# Patient Record
Sex: Male | Born: 1955
Health system: Southern US, Community
[De-identification: ages and names within clinical notes are randomized; demographics above are authoritative.]

## PROBLEM LIST (undated history)

## (undated) DIAGNOSIS — G8929 Other chronic pain: Secondary | ICD-10-CM

## (undated) DIAGNOSIS — M549 Dorsalgia, unspecified: Secondary | ICD-10-CM

## (undated) DIAGNOSIS — I7121 Aneurysm of the ascending aorta, without rupture: Secondary | ICD-10-CM

## (undated) DIAGNOSIS — E291 Testicular hypofunction: Secondary | ICD-10-CM

## (undated) DIAGNOSIS — E785 Hyperlipidemia, unspecified: Secondary | ICD-10-CM

## (undated) DIAGNOSIS — I712 Thoracic aortic aneurysm, without rupture: Secondary | ICD-10-CM

## (undated) HISTORY — DX: Testicular hypofunction: E29.1

## (undated) HISTORY — DX: Dorsalgia, unspecified: M54.9

## (undated) HISTORY — DX: Other chronic pain: G89.29

## (undated) HISTORY — DX: Aneurysm of the ascending aorta, without rupture: I71.21

## (undated) HISTORY — DX: Thoracic aortic aneurysm, without rupture: I71.2

## (undated) HISTORY — PX: COLONOSCOPY: SHX174

## (undated) HISTORY — DX: Hyperlipidemia, unspecified: E78.5

## (undated) HISTORY — PX: ANTERIOR CRUCIATE LIGAMENT REPAIR: SHX115

---

## 2014-11-07 ENCOUNTER — Telehealth: Payer: Self-pay | Admitting: Cardiology

## 2014-11-07 NOTE — Telephone Encounter (Signed)
Received records from [facility] for appointment on 12/19/14 with Dr Percival Spanish.  Records given to [facility] (medical records) for Dr Hochrein's schedule on 12/19/14.  lp

## 2014-12-19 ENCOUNTER — Ambulatory Visit (INDEPENDENT_AMBULATORY_CARE_PROVIDER_SITE_OTHER): Payer: BLUE CROSS/BLUE SHIELD | Admitting: Cardiology

## 2014-12-19 ENCOUNTER — Encounter: Payer: Self-pay | Admitting: Cardiology

## 2014-12-19 ENCOUNTER — Telehealth: Payer: Self-pay | Admitting: Cardiology

## 2014-12-19 VITALS — BP 128/80 | HR 62 | Ht 69.0 in | Wt 183.0 lb

## 2014-12-19 DIAGNOSIS — I712 Thoracic aortic aneurysm, without rupture, unspecified: Secondary | ICD-10-CM

## 2014-12-19 NOTE — Progress Notes (Signed)
Cardiology Office Note   Date:  12/20/2014   ID:  Gary Moody, DOB 1955-10-12, MRN [id number]  PCP:  Velna Hatchet, MD  Cardiologist:   Minus Breeding, MD   Chief Complaint  Patient presents with  . Thoracic Aortic Aneurysm      History of Present Illness: Gary Moody is a 59 y.o. male who presents for a sending aortic aneurysm. He moved from [place] with this was followed for years. I don't have any of these records. Apparently there was no other significant heart disease. He's had some palpitations over the years but this has been controlled to a large degree with diet and caffeine restriction. He otherwise has done well. He was told not to do endurance athletics but he still does walking and other activities without limitations. The patient denies any new symptoms such as chest discomfort, neck or arm discomfort. There has been no new shortness of breath, PND or orthopnea. There has been no presyncope or syncope.  Past Medical History  Diagnosis Date  . Hypogonadism in male   . Chronic back pain   . Thoracic ascending aortic aneurysm     Past Surgical History  Procedure Laterality Date  . Anterior cruciate ligament repair       Current Outpatient Prescriptions  Medication Sig Dispense Refill  . atorvastatin (LIPITOR) 20 MG tablet Take 20 mg by mouth daily.    Marland Kitchen atorvastatin (LIPITOR) 40 MG tablet Take 40 mg by mouth daily.    Marland Kitchen losartan (COZAAR) 50 MG tablet Take 50 mg by mouth daily.    . metoprolol succinate (TOPROL-XL) 25 MG 24 hr tablet Take 25 mg by mouth daily.    Marland Kitchen testosterone (ANDROGEL) 50 MG/5GM (1%) GEL Place 5 g onto the skin daily.     No current facility-administered medications for this visit.    Allergies:   Avelox and Sulfa antibiotics    Social History:  The patient  reports that he has never smoked. He does not have any smokeless tobacco history on file.   Family History:  The patient's family history includes CAD in his father;  Diabetes in his mother; Syncope episode in his son and another family member.    ROS:  Please see the history of present illness.   Otherwise, review of systems are positive for none.   All other systems are reviewed and negative.    PHYSICAL EXAM: VS:  BP 128/80 mmHg  Pulse 62  Ht 5\' 9"  (1.753 m)  Wt 183 lb (83.008 kg)  BMI 27.01 kg/m2 , BMI Body mass index is 27.01 kg/(m^2). GENERAL:  Well appearing HEENT:  Pupils equal round and reactive, fundi not visualized, oral mucosa unremarkable NECK:  No jugular venous distention, waveform within normal limits, carotid upstroke brisk and symmetric, no bruits, no thyromegaly LYMPHATICS:  No cervical, inguinal adenopathy LUNGS:  Clear to auscultation bilaterally BACK:  No CVA tenderness CHEST:  Unremarkable HEART:  PMI not displaced or sustained,S1 and S2 within normal limits, no S3, no S4, no clicks, no rubs, no murmurs ABD:  Flat, positive bowel sounds normal in frequency in pitch, no bruits, no rebound, no guarding, no midline pulsatile mass, no hepatomegaly, no splenomegaly EXT:  2 plus pulses throughout, no edema, no cyanosis no clubbing SKIN:  No rashes no nodules NEURO:  Cranial nerves II through XII grossly intact, motor grossly intact throughout PSYCH:  Cognitively intact, oriented to person place and time    EKG:  EKG is ordered  today. The ekg ordered today demonstrates sinus bradycardia, rate 46, axis within normal limits, intervals within normal limits, no acute ST-T wave changes.   Wt Readings from Last 3 Encounters:  12/19/14 183 lb (83.008 kg)      Other studies Reviewed: Additional studies/ records that were reviewed today include: Outside office records. Review of the above records demonstrates:  Please see elsewhere in the note.     ASSESSMENT AND PLAN:  ASCENDING ANEURYSM:   I would like to have the outside records be ordering an MRI.   further evaluation and management will be based on a review these outside  records and then a follow-up study. He can continue the meds as listed however.   DYSLIPIDEMIA:  This is to be followed by    Current medicines are reviewed at length with the patient today.  The patient does not have concerns regarding medicines.  The following changes have been made:  no change  Labs/ tests ordered today include:   No orders of the defined types were placed in this encounter.     Disposition:   FU with me in one year.    Signed, Minus Breeding, MD  12/20/2014 12:55 PM    [facility]

## 2014-12-19 NOTE — Patient Instructions (Signed)
Your physician recommends that you schedule a follow-up appointment in: as needed with Dr. Hochrein  

## 2014-12-20 ENCOUNTER — Encounter: Payer: Self-pay | Admitting: Cardiology

## 2014-12-20 ENCOUNTER — Other Ambulatory Visit: Payer: Self-pay | Admitting: *Deleted

## 2014-12-20 DIAGNOSIS — I712 Thoracic aortic aneurysm, without rupture, unspecified: Secondary | ICD-10-CM | POA: Insufficient documentation

## 2014-12-20 NOTE — Telephone Encounter (Signed)
Allergies added

## 2014-12-20 NOTE — Telephone Encounter (Signed)
Close encounter 

## 2014-12-24 ENCOUNTER — Other Ambulatory Visit: Payer: Self-pay | Admitting: Cardiology

## 2014-12-24 MED ORDER — LOSARTAN POTASSIUM 50 MG PO TABS: 50.0000 mg | ORAL_TABLET | Freq: Every day | ORAL | Status: DC

## 2014-12-24 MED ORDER — ATORVASTATIN CALCIUM 20 MG PO TABS
20.0000 mg | ORAL_TABLET | Freq: Every day | ORAL | Status: DC
Start: 2014-12-24 — End: 2015-12-21

## 2014-12-24 MED ORDER — ATORVASTATIN CALCIUM 40 MG PO TABS
40.0000 mg | ORAL_TABLET | Freq: Every day | ORAL | Status: DC
Start: 2014-12-24 — End: 2015-12-21

## 2014-12-24 MED ORDER — METOPROLOL SUCCINATE ER 25 MG PO TB24
25.0000 mg | ORAL_TABLET | Freq: Every day | ORAL | Status: DC
Start: 2014-12-24 — End: 2015-12-25

## 2014-12-24 NOTE — Addendum Note (Signed)
Addended by: Diana Eves on: 12/24/2014 11:11 AM   Modules accepted: Orders

## 2014-12-24 NOTE — Telephone Encounter (Signed)
°  1. Which medications need to be refilled? Losartan,Atorvastatin 40 mg and 20 mg and Metoprolol 2. Which pharmacy is medication to be sent to?[facility]-Please do this asap please.  3. Do they need a 30 day or 90 day supply?#280 for Losartan and #90 fir the rest of them 4. Would they like a call back once the medication has been sent to the pharmacy? yes

## 2014-12-24 NOTE — Telephone Encounter (Signed)
Rx(s) sent to pharmacy electronically.  

## 2015-01-28 ENCOUNTER — Encounter: Payer: Self-pay | Admitting: Cardiology

## 2015-01-29 ENCOUNTER — Other Ambulatory Visit: Payer: Self-pay | Admitting: *Deleted

## 2015-01-29 ENCOUNTER — Telehealth: Payer: Self-pay | Admitting: Cardiology

## 2015-01-29 DIAGNOSIS — I712 Thoracic aortic aneurysm, without rupture, unspecified: Secondary | ICD-10-CM

## 2015-01-29 NOTE — Telephone Encounter (Signed)
Pt's wife called in stating that when the pt came in to see Dr. Percival Spanish in May she says that his prescription for Losartan was decreased to once a day. She says originally it was written for BID. She just wanted to confirm that the dosage is correct. Please call  Thanks

## 2015-01-31 ENCOUNTER — Other Ambulatory Visit: Payer: BLUE CROSS/BLUE SHIELD

## 2015-01-31 ENCOUNTER — Other Ambulatory Visit: Payer: Self-pay | Admitting: Cardiology

## 2015-01-31 MED ORDER — LOSARTAN POTASSIUM 50 MG PO TABS: 50.0000 mg | ORAL_TABLET | Freq: Two times a day (BID) | ORAL | Status: DC

## 2015-01-31 NOTE — Telephone Encounter (Signed)
If he is taking it twice daily he should continue with this.

## 2015-01-31 NOTE — Telephone Encounter (Signed)
Spoke with pt wife, told her that her Husband need to continue take Losartan 50 mg 2 tablets daily.  [facility]ills

## 2015-02-07 ENCOUNTER — Ambulatory Visit
Admission: RE | Admit: 2015-02-07 | Discharge: 2015-02-07 | Disposition: A | Discharge status: Home / Self Care | Payer: BLUE CROSS/BLUE SHIELD | Source: Ambulatory Visit | Attending: Cardiology | Admitting: Cardiology

## 2015-02-07 DIAGNOSIS — I712 Thoracic aortic aneurysm, without rupture, unspecified: Secondary | ICD-10-CM

## 2015-02-07 MED ORDER — GADOBENATE DIMEGLUMINE 529 MG/ML IV SOLN
17.0000 mL | Freq: Once | INTRAVENOUS | Status: AC | PRN
  Administered 2015-02-07: 17 mL via INTRAVENOUS

## 2015-02-11 ENCOUNTER — Telehealth: Payer: Self-pay | Admitting: Cardiology

## 2015-02-11 NOTE — Telephone Encounter (Signed)
Faxed signed release to Dr Etta Grandchild - [facility] for records requested by Dr Percival Spanish.  Faxed on 02/11/15. lp

## 2015-02-12 ENCOUNTER — Telehealth: Payer: Self-pay | Admitting: Cardiology

## 2015-02-12 NOTE — Telephone Encounter (Signed)
I have received multiple notes from the patient's previous physician in [place]. The patient has had a stable thoracic aortic dilatation for many years dating back at least 2008. It has been 44-45 mm. The MRI that we did recently was 22. This is essentially unchanged. As he is new DOS I will repeat this MRI in 6 months. This will need to be scheduled. Of note the patient did have some coronary calcification previously but no suggestion of obstructive coronary disease though I don't see a CT angiogram that would clearly define this. There was a vague right lung lesion. I don't see a follow-up of this since 2015.  However there were no gross abnormalities on the lungs on the recent MRI.

## 2015-02-21 ENCOUNTER — Other Ambulatory Visit: Payer: Self-pay | Admitting: *Deleted

## 2015-02-21 DIAGNOSIS — I712 Thoracic aortic aneurysm, without rupture, unspecified: Secondary | ICD-10-CM

## 2015-02-26 ENCOUNTER — Other Ambulatory Visit: Payer: Self-pay | Admitting: Internal Medicine

## 2015-02-26 DIAGNOSIS — R911 Solitary pulmonary nodule: Secondary | ICD-10-CM

## 2015-03-01 ENCOUNTER — Ambulatory Visit
Admission: RE | Admit: 2015-03-01 | Discharge: 2015-03-01 | Disposition: A | Discharge status: Home / Self Care | Payer: BLUE CROSS/BLUE SHIELD | Source: Ambulatory Visit | Attending: Internal Medicine | Admitting: Internal Medicine

## 2015-03-01 DIAGNOSIS — R911 Solitary pulmonary nodule: Secondary | ICD-10-CM

## 2015-03-08 ENCOUNTER — Telehealth: Payer: Self-pay | Admitting: Cardiology

## 2015-03-08 NOTE — Telephone Encounter (Signed)
Called patient and let him know that Dr. Percival Spanish is out of town on vacation and would not be able to get back to him until at least next week  I told him that I would forward his message to Dr. Percival Spanish and his nurse Scharlene Corn CMA

## 2015-03-08 NOTE — Telephone Encounter (Signed)
Pt is calling in to see what Dr. Rosezella [place] thoughts were on the records that he received from his last doctor. Please call and f/u with the pt  Thanks

## 2015-03-25 NOTE — Telephone Encounter (Signed)
Has this been handled?

## 2015-03-26 NOTE — Telephone Encounter (Signed)
Can this be closed ?

## 2015-03-28 NOTE — Telephone Encounter (Signed)
Spoke to dr Percival Spanish about pt, stated he will personal call pt with MRI result and comparison

## 2015-03-29 ENCOUNTER — Encounter: Payer: Self-pay | Admitting: *Deleted

## 2015-12-21 ENCOUNTER — Other Ambulatory Visit: Payer: Self-pay | Admitting: Cardiology

## 2015-12-23 NOTE — Telephone Encounter (Signed)
Rx(s) sent to pharmacy electronically.  

## 2015-12-24 ENCOUNTER — Ambulatory Visit (INDEPENDENT_AMBULATORY_CARE_PROVIDER_SITE_OTHER): Payer: BLUE CROSS/BLUE SHIELD | Admitting: Cardiology

## 2015-12-24 ENCOUNTER — Encounter: Payer: Self-pay | Admitting: Cardiology

## 2015-12-24 VITALS — BP 138/82 | HR 61 | Ht 69.0 in | Wt 181.0 lb

## 2015-12-24 DIAGNOSIS — I2510 Atherosclerotic heart disease of native coronary artery without angina pectoris: Secondary | ICD-10-CM | POA: Diagnosis not present

## 2015-12-24 DIAGNOSIS — R931 Abnormal findings on diagnostic imaging of heart and coronary circulation: Secondary | ICD-10-CM

## 2015-12-24 DIAGNOSIS — I712 Thoracic aortic aneurysm, without rupture, unspecified: Secondary | ICD-10-CM

## 2015-12-24 NOTE — Progress Notes (Signed)
Cardiology Office Note   Date:  12/24/2015   ID:  Gary Moody, DOB 1955/09/11, MRN [id number]  PCP:  Velna Hatchet, MD  Cardiologist:   Minus Breeding, MD   No chief complaint on file.     History of Present Illness: Gary Moody is a 60 y.o. male who presents for a sending aortic aneurysm. He moved from [place] last year.  I was able to review these records.  I followed up last year with an MRI and he had a 4.7 x 4.7 ascending aneurysm stable from previous. His primary care provider also order a chest CT for other reasons. There was some coronary calcium noted.  He does very well. He walks a lot at work. He's had some palpitations over the years but this is stable.  The patient denies any new symptoms such as chest discomfort, neck or arm discomfort. There has been no new shortness of breath, PND or orthopnea. There has been no presyncope or syncope.   Past Medical History  Diagnosis Date  . Hypogonadism in male   . Chronic back pain   . Thoracic ascending aortic aneurysm [facility])     Past Surgical History  Procedure Laterality Date  . Anterior cruciate ligament repair       Current Outpatient Prescriptions  Medication Sig Dispense Refill  . atorvastatin (LIPITOR) 20 MG tablet TAKE 1 TABLET DAILY 90 tablet 2  . atorvastatin (LIPITOR) 40 MG tablet TAKE 1 TABLET DAILY 90 tablet 2  . losartan (COZAAR) 50 MG tablet Take 1 tablet (50 mg total) by mouth 2 (two) times daily. 180 tablet 3  . metoprolol succinate (TOPROL-XL) 25 MG 24 hr tablet Take 1 tablet (25 mg total) by mouth daily. 90 tablet 3  . testosterone (ANDROGEL) 50 MG/5GM (1%) GEL Place 5 g onto the skin daily.     No current facility-administered medications for this visit.    Allergies:   Avelox and Sulfa antibiotics    ROS:  Please see the history of present illness.   Otherwise, review of systems are positive for none.   All other systems are reviewed and negative.    PHYSICAL EXAM: VS:  BP 138/82  mmHg  Pulse 61  Ht 5\' 9"  (1.753 m)  Wt 181 lb (82.101 kg)  BMI 26.72 kg/m2  SpO2 96% , BMI Body mass index is 26.72 kg/(m^2). GENERAL:  Well appearing NECK:  No jugular venous distention, waveform within normal limits, carotid upstroke brisk and symmetric, no bruits, no thyromegaly LUNGS:  Clear to auscultation bilaterally BACK:  No CVA tenderness CHEST:  Unremarkable HEART:  PMI not displaced or sustained,S1 and S2 within normal limits, no S3, no S4, no clicks, no rubs, no murmurs ABD:  Flat, positive bowel sounds normal in frequency in pitch, no bruits, no rebound, no guarding, no midline pulsatile mass, no hepatomegaly, no splenomegaly EXT:  2 plus pulses throughout, no edema, no cyanosis no clubbing   EKG:  EKG is ordered today. The ekg ordered today demonstrates sinus bradycardia, rate 61, axis within normal limits, intervals within normal limits, no acute ST-T wave changes.   Wt Readings from Last 3 Encounters:  12/24/15 181 lb (82.101 kg)  12/19/14 183 lb (83.008 kg)      Other studies Reviewed: Additional studies/ records that were reviewed today include: MRI, CT Review of the above records demonstrates:  Please see elsewhere in the note.     ASSESSMENT AND PLAN:  ASCENDING ANEURYSM:   I  will follow up with a repeat MRI in Dec.  DYSLIPIDEMIA:  This is to be followed by Velna Hatchet, MD   CORONARY CALCIUM:   I will bring the patient back for a POET (Plain Old Exercise Test). This will allow me to screen for obstructive coronary disease, risk stratify and very importantly provide a prescription for exercise.   Current medicines are reviewed at length with the patient today.  The patient does not have concerns regarding medicines.  The following changes have been made:  no change  Labs/ tests ordered today include:      Orders Placed This Encounter  Procedures  . MR MRA CHEST W WO CONTRAST  . Exercise Tolerance Test  . EKG 12-Lead     Disposition:   FU  with me in one year.    Signed, Minus Breeding, MD  12/24/2015 5:37 PM    [place]

## 2015-12-24 NOTE — Patient Instructions (Addendum)
Medication Instructions:  Continue current medications  Labwork: NONE  Testing/Procedures: Your physician has requested that you have an exercise tolerance test. For further information please visit [email or web address]. Please also follow instruction sheet, as given.  Your physician has requested that you have an MRI in December 2017   Follow-Up: Your physician wants you to follow-up in: 1 Year. You will receive a reminder letter in the mail two months in advance. If you don't receive a letter, please call our office to schedule the follow-up appointment.  Any Other Special Instructions Will Be Listed Below (If Applicable).  If you need a refill on your cardiac medications before your next appointment, please call your pharmacy.

## 2015-12-25 ENCOUNTER — Encounter: Payer: Self-pay | Admitting: Cardiology

## 2015-12-25 ENCOUNTER — Telehealth: Payer: Self-pay | Admitting: Cardiology

## 2015-12-25 MED ORDER — METOPROLOL SUCCINATE ER 25 MG PO TB24: 25.0000 mg | ORAL_TABLET | Freq: Every day | ORAL | Status: DC

## 2015-12-25 MED ORDER — LOSARTAN POTASSIUM 50 MG PO TABS: 50.0000 mg | ORAL_TABLET | Freq: Two times a day (BID) | ORAL | Status: DC

## 2015-12-25 NOTE — Telephone Encounter (Signed)
Patient was seen yesterday by Dr. Percival Spanish and would like [place] to manage all of his medications.  He needs refills for his Losartan 50 mg # 90 with 3 refills and the metoprolol succinate 25 mg # 90 with 3 refills.  He would like these sent to [facility].

## 2015-12-25 NOTE — Telephone Encounter (Signed)
Rx(s) sent to pharmacy electronically.  

## 2015-12-25 NOTE — Telephone Encounter (Signed)
Left message for patient to call back to schedule exercise tolerance test per Dr. Percival Spanish.

## 2016-01-02 ENCOUNTER — Encounter: Payer: Self-pay | Admitting: Cardiology

## 2016-01-13 ENCOUNTER — Telehealth: Payer: Self-pay | Admitting: Cardiology

## 2016-01-13 NOTE — Telephone Encounter (Signed)
Spoke to patient Information explained to patient - exercise tolerance test Reviewed last office note. Patient verbalized understanding.

## 2016-01-13 NOTE — Telephone Encounter (Signed)
New message      The pt was wanting to speak with the nurse about the reason for the "Stress Test", cause the pt states he is not having any symptoms and would like some insight why he might need a stress test.

## 2016-01-16 ENCOUNTER — Inpatient Hospital Stay (HOSPITAL_COMMUNITY): Admission: RE | Admit: 2016-01-16 | Payer: BLUE CROSS/BLUE SHIELD | Source: Ambulatory Visit

## 2016-04-30 ENCOUNTER — Telehealth (HOSPITAL_COMMUNITY): Payer: Self-pay | Admitting: Cardiology

## 2016-04-30 NOTE — Telephone Encounter (Signed)
Pt has been contacted 4 times to get his ETT scheduled(6/14,7/6,9/12,9/27) and have yet to receive a response. I will be removing him from the workqueue.

## 2016-04-30 NOTE — Telephone Encounter (Signed)
Pt ended up calling back and and he was scheduled for 11/8.

## 2016-04-30 NOTE — Telephone Encounter (Signed)
ok 

## 2016-05-04 NOTE — Telephone Encounter (Signed)
Great - thanks

## 2016-06-05 ENCOUNTER — Telehealth (HOSPITAL_COMMUNITY): Payer: Self-pay

## 2016-06-05 NOTE — Telephone Encounter (Signed)
Encounter complete. 

## 2016-06-10 ENCOUNTER — Inpatient Hospital Stay (HOSPITAL_COMMUNITY): Admission: RE | Admit: 2016-06-10 | Payer: BLUE CROSS/BLUE SHIELD | Source: Ambulatory Visit

## 2016-06-19 ENCOUNTER — Telehealth (HOSPITAL_COMMUNITY): Payer: Self-pay

## 2016-06-19 NOTE — Telephone Encounter (Signed)
Encounter complete. 

## 2016-06-23 ENCOUNTER — Ambulatory Visit (HOSPITAL_COMMUNITY)
Admission: RE | Admit: 2016-06-23 | Discharge: 2016-06-23 | Disposition: A | Discharge status: Home / Self Care | Payer: BLUE CROSS/BLUE SHIELD | Source: Ambulatory Visit | Attending: Cardiovascular Disease | Admitting: Cardiovascular Disease

## 2016-06-23 DIAGNOSIS — R931 Abnormal findings on diagnostic imaging of heart and coronary circulation: Secondary | ICD-10-CM | POA: Insufficient documentation

## 2016-06-23 LAB — EXERCISE TOLERANCE TEST
CHL CUP RESTING HR STRESS: 70 {beats}/min
CSEPEW: 13.4 METS
CSEPPHR: 160 {beats}/min
Exercise duration (min): 11 min
Exercise duration (sec): 23 s
MPHR: 161 {beats}/min
Percent HR: 99 %
RPE: 17

## 2016-07-07 ENCOUNTER — Ambulatory Visit (HOSPITAL_COMMUNITY): Admission: RE | Admit: 2016-07-07 | Payer: BLUE CROSS/BLUE SHIELD | Source: Ambulatory Visit

## 2016-07-08 ENCOUNTER — Ambulatory Visit (HOSPITAL_COMMUNITY)
Admission: RE | Admit: 2016-07-08 | Discharge: 2016-07-08 | Disposition: A | Discharge status: Home / Self Care | Payer: BLUE CROSS/BLUE SHIELD | Source: Ambulatory Visit | Attending: Cardiology | Admitting: Cardiology

## 2016-07-08 DIAGNOSIS — I712 Thoracic aortic aneurysm, without rupture, unspecified: Secondary | ICD-10-CM

## 2016-07-08 MED ORDER — GADOBENATE DIMEGLUMINE 529 MG/ML IV SOLN
20.0000 mL | Freq: Once | INTRAVENOUS | Status: AC | PRN
  Administered 2016-07-08: 20 mL via INTRAVENOUS

## 2016-09-09 ENCOUNTER — Encounter: Payer: Self-pay | Admitting: Internal Medicine

## 2016-09-18 ENCOUNTER — Other Ambulatory Visit: Payer: Self-pay | Admitting: Cardiology

## 2016-09-18 NOTE — Telephone Encounter (Signed)
REFILL 

## 2017-01-11 NOTE — Progress Notes (Signed)
Cardiology Office Note   Date:  01/14/2017   ID:  Gary Moody, DOB 1955/11/12, MRN [id number]  PCP:  Velna Hatchet, MD  Cardiologist:   Minus Breeding, MD   Chief Complaint  Patient presents with  . Ascending Aortic Aneursym      History of Present Illness: Gary Moody is a 61 y.o. male who presents for a sending aortic aneurysm. He moved from [place] .  I followed up last Dec with an MRI that was 47 mm and unchanged from previous.  He has had coronary calcium and had a negative POET (Plain Old Exercise Treadmill) last year.  Since then he has done well.  The patient denies any new symptoms such as chest discomfort, neck or arm discomfort. There has been no new shortness of breath, PND or orthopnea. There have been no reported palpitations, presyncope or syncope.    Past Medical History:  Diagnosis Date  . Chronic back pain   . Hypogonadism in male   . Thoracic ascending aortic aneurysm [facility])     Past Surgical History:  Procedure Laterality Date  . ANTERIOR CRUCIATE LIGAMENT REPAIR       Current Outpatient Prescriptions  Medication Sig Dispense Refill  . atorvastatin (LIPITOR) 20 MG tablet TAKE 1 TABLET DAILY 90 tablet 1  . atorvastatin (LIPITOR) 40 MG tablet TAKE 1 TABLET DAILY 90 tablet 1  . losartan (COZAAR) 50 MG tablet Take 1 tablet (50 mg total) by mouth 2 (two) times daily. 180 tablet 3  . metoprolol succinate (TOPROL-XL) 25 MG 24 hr tablet Take 1 tablet (25 mg total) by mouth daily. 90 tablet 3  . testosterone (ANDROGEL) 50 MG/5GM (1%) GEL Place 5 g onto the skin daily.     No current facility-administered medications for this visit.     Allergies:   Avelox [moxifloxacin hcl in nacl] and Sulfa antibiotics    ROS:  Please see the history of present illness.   Otherwise, review of systems are positive for varicose veins.   All other systems are reviewed and negative.    PHYSICAL EXAM: VS:  BP 114/70   Pulse (!) 58   Ht 5\' 9"  (1.753 m)   Wt  176 lb (79.8 kg)   BMI 25.99 kg/m  , BMI Body mass index is 25.99 kg/m.  GENERAL:  Well appearing NECK:  No jugular venous distention, waveform within normal limits, carotid upstroke brisk and symmetric, no bruits, no thyromegaly LUNGS:  Clear to auscultation bilaterally BACK:  No CVA tenderness CHEST:  Unremarkable HEART:  PMI not displaced or sustained,S1 and S2 within normal limits, no S3, no S4, no clicks, no rubs, no murmurs ABD:  Flat, positive bowel sounds normal in frequency in pitch, no bruits, no rebound, no guarding, no midline pulsatile mass, no hepatomegaly, no splenomegaly EXT:  2 plus pulses throughout, no edema, no cyanosis no clubbing, mild varicose veins   EKG:  EKG is  ordered today. The ekg ordered today demonstrates sinus bradycardia, rate 58, axis within normal limits, intervals within normal limits, no acute ST-T wave changes.   Wt Readings from Last 3 Encounters:  01/14/17 176 lb (79.8 kg)  12/24/15 181 lb (82.1 kg)  12/19/14 183 lb (83 kg)      Other studies Reviewed: Additional studies/ records that were reviewed today include: MRI results, I personally reviewed CT films from last year. Review of the above records demonstrates:  Please see elsewhere in the note.     ASSESSMENT  AND PLAN:  ASCENDING ANEURYSM:   This was 47 mm on MRI in Dec of last year and this was stable.  I will follow up with a repeat MRI in Dec of this year.  DYSLIPIDEMIA:  This is to be followed by Velna Hatchet, MD   LDL last year was 12 with HDL of 43.  This will be repeated this month.   CORONARY CALCIUM:   POET (Plain Old Exercise Treadmill) was negative last November.  I looked at the CT and he had mild calcium and he was able to exercise for almost 12 minutes last year on the treadmill.  He is very active and has no symptoms.  I don't think that any further work up is indicated.  He will continue with risk reduction.    Current medicines are reviewed at length with the  patient today.  The patient does not have concerns regarding medicines.  The following changes have been made:  None  Labs/ tests ordered today include:      Orders Placed This Encounter  Procedures  . MR MRA CHEST W WO CONTRAST  . EKG 12-Lead     Disposition:   FU with me in 12 months.    Signed, Minus Breeding, MD  01/14/2017 10:16 AM    [place]

## 2017-01-14 ENCOUNTER — Encounter: Payer: Self-pay | Admitting: Cardiology

## 2017-01-14 ENCOUNTER — Ambulatory Visit (INDEPENDENT_AMBULATORY_CARE_PROVIDER_SITE_OTHER): Payer: BLUE CROSS/BLUE SHIELD | Admitting: Cardiology

## 2017-01-14 VITALS — BP 114/70 | HR 58 | Ht 69.0 in | Wt 176.0 lb

## 2017-01-14 DIAGNOSIS — R931 Abnormal findings on diagnostic imaging of heart and coronary circulation: Secondary | ICD-10-CM | POA: Diagnosis not present

## 2017-01-14 DIAGNOSIS — E785 Hyperlipidemia, unspecified: Secondary | ICD-10-CM | POA: Diagnosis not present

## 2017-01-14 DIAGNOSIS — I712 Thoracic aortic aneurysm, without rupture, unspecified: Secondary | ICD-10-CM

## 2017-01-14 DIAGNOSIS — I10 Essential (primary) hypertension: Secondary | ICD-10-CM | POA: Diagnosis not present

## 2017-01-14 NOTE — Patient Instructions (Signed)
Medication Instructions:  Continue current medications  Labwork: None Ordered  Testing/Procedures: Your physician has requested that you have a MRI/MRA in December  Follow-Up: Your physician wants you to follow-up in: 1 Year. You will receive a reminder letter in the mail two months in advance. If you don't receive a letter, please call our office to schedule the follow-up appointment.   Any Other Special Instructions Will Be Listed Below (If Applicable).   If you need a refill on your cardiac medications before your next appointment, please call your pharmacy.

## 2017-01-20 ENCOUNTER — Telehealth: Payer: Self-pay | Admitting: Cardiology

## 2017-01-20 NOTE — Telephone Encounter (Signed)
Called and LVM for the patient to call me back regarding time of day he would like his MRA.

## 2017-01-22 ENCOUNTER — Other Ambulatory Visit: Payer: Self-pay | Admitting: Cardiology

## 2017-02-01 ENCOUNTER — Encounter: Payer: Self-pay | Admitting: Cardiology

## 2017-02-10 ENCOUNTER — Encounter: Payer: Self-pay | Admitting: Cardiology

## 2017-02-12 ENCOUNTER — Ambulatory Visit (HOSPITAL_COMMUNITY): Payer: BLUE CROSS/BLUE SHIELD

## 2017-02-27 ENCOUNTER — Other Ambulatory Visit: Payer: Self-pay | Admitting: Cardiology

## 2017-03-10 ENCOUNTER — Encounter: Payer: Self-pay | Admitting: Gastroenterology

## 2017-03-17 ENCOUNTER — Other Ambulatory Visit: Payer: Self-pay | Admitting: Cardiology

## 2017-04-28 ENCOUNTER — Ambulatory Visit (AMBULATORY_SURGERY_CENTER): Payer: Self-pay | Admitting: *Deleted

## 2017-04-28 VITALS — Ht 69.0 in | Wt 177.0 lb

## 2017-04-28 DIAGNOSIS — Z1211 Encounter for screening for malignant neoplasm of colon: Secondary | ICD-10-CM

## 2017-04-28 MED ORDER — NA SULFATE-K SULFATE-MG SULF 17.5-3.13-1.6 GM/177ML PO SOLN: 1.0000 | Freq: Once | ORAL | 0 refills | Status: AC

## 2017-04-28 NOTE — Progress Notes (Signed)
No egg or soy allergy known to patient  No issues with past sedation with any surgeries  or procedures, no intubation problems  No diet pills per patient No home 02 use per patient  No blood thinners per patient  Pt denies issues with constipation  No A fib or A flutter  EMMI video sent to pt's e mail -- pt declined $15 dollar coupon for suprep given to pt today  In PV  Pt states his dad may have had colon cancer- he said the mass was encapsulated, he had surgery but no chemo / radiation so not sure location- will check with family and call us with information

## 2017-05-12 ENCOUNTER — Encounter: Payer: BLUE CROSS/BLUE SHIELD | Admitting: Gastroenterology

## 2017-05-20 ENCOUNTER — Encounter: Payer: BLUE CROSS/BLUE SHIELD | Admitting: Gastroenterology

## 2017-06-10 ENCOUNTER — Encounter: Payer: Self-pay | Admitting: Gastroenterology

## 2017-06-17 ENCOUNTER — Other Ambulatory Visit: Payer: Self-pay

## 2017-06-17 ENCOUNTER — Encounter: Payer: Self-pay | Admitting: Gastroenterology

## 2017-06-17 ENCOUNTER — Ambulatory Visit (AMBULATORY_SURGERY_CENTER): Payer: BLUE CROSS/BLUE SHIELD | Admitting: Gastroenterology

## 2017-06-17 VITALS — BP 117/69 | HR 48 | Temp 97.5°F | Resp 16 | Ht 69.0 in | Wt 177.0 lb

## 2017-06-17 DIAGNOSIS — D123 Benign neoplasm of transverse colon: Secondary | ICD-10-CM | POA: Diagnosis not present

## 2017-06-17 DIAGNOSIS — D122 Benign neoplasm of ascending colon: Secondary | ICD-10-CM

## 2017-06-17 DIAGNOSIS — K635 Polyp of colon: Secondary | ICD-10-CM | POA: Diagnosis not present

## 2017-06-17 DIAGNOSIS — Z1212 Encounter for screening for malignant neoplasm of rectum: Secondary | ICD-10-CM

## 2017-06-17 DIAGNOSIS — Z1211 Encounter for screening for malignant neoplasm of colon: Secondary | ICD-10-CM | POA: Diagnosis present

## 2017-06-17 DIAGNOSIS — D120 Benign neoplasm of cecum: Secondary | ICD-10-CM

## 2017-06-17 MED ORDER — SODIUM CHLORIDE 0.9 % IV SOLN
500.0000 mL | INTRAVENOUS | Status: DC
Start: 2017-06-17 — End: 2017-06-17

## 2017-06-17 NOTE — Progress Notes (Signed)
Called to room to assist during endoscopic procedure.  Patient ID and intended procedure confirmed with present staff. Received instructions for my participation in the procedure from the performing physician.  

## 2017-06-17 NOTE — Progress Notes (Signed)
Report given to PACU, vss 

## 2017-06-17 NOTE — Patient Instructions (Signed)
YOU HAD AN ENDOSCOPIC PROCEDURE TODAY AT [facility]:   Refer to the procedure report that was given to you for any specific questions about what was found during the examination.  If the procedure report does not answer your questions, please call your gastroenterologist to clarify.  If you requested that your care partner not be given the details of your procedure findings, then the procedure report has been included in a sealed envelope for you to review at your convenience later.  YOU SHOULD EXPECT: Some feelings of bloating in the abdomen. Passage of more gas than usual.  Walking can help get rid of the air that was put into your GI tract during the procedure and reduce the bloating. If you had a lower endoscopy (such as a colonoscopy or flexible sigmoidoscopy) you may notice spotting of blood in your stool or on the toilet paper. If you underwent a bowel prep for your procedure, you may not have a normal bowel movement for a few days.  Please Note:  You might notice some irritation and congestion in your nose or some drainage.  This is from the oxygen used during your procedure.  There is no need for concern and it should clear up in a day or so.  SYMPTOMS TO REPORT IMMEDIATELY:   Following lower endoscopy (colonoscopy or flexible sigmoidoscopy):  Excessive amounts of blood in the stool  Significant tenderness or worsening of abdominal pains  Swelling of the abdomen that is new, acute  Fever of 100F or higher  For urgent or emergent issues, a gastroenterologist can be reached at any hour by calling [phone].   DIET:  We do recommend a small meal at first, but then you may proceed to your regular diet.  Drink plenty of fluids but you should avoid alcoholic beverages for 24 hours.  ACTIVITY:  You should plan to take it easy for the rest of today and you should NOT DRIVE or use heavy machinery until tomorrow (because of the sedation medicines used during the test).     FOLLOW UP: Our staff will call the number listed on your records the next business day following your procedure to check on you and address any questions or concerns that you may have regarding the information given to you following your procedure. If we do not reach you, we will leave a message.  However, if you are feeling well and you are not experiencing any problems, there is no need to return our call.  We will assume that you have returned to your regular daily activities without incident.  If any biopsies were taken you will be contacted by phone or by letter within the next 1-3 weeks.  Please call us at [phone] if you have not heard about the biopsies in 3 weeks.   Await for biopsy results to determine next repeat Colonoscopy screening Polyps (handout given) .Marland KitchenNo ibuprofen, naproxen or other non-steroidal anti-inflammatory drugs for 2 weeks after polyp removal. Tylenol okay if needed.    SIGNATURES/CONFIDENTIALITY: You and/or your care partner have signed paperwork which will be entered into your electronic medical record.  These signatures attest to the fact that that the information above on your After Visit Summary has been reviewed and is understood.  Full responsibility of the confidentiality of this discharge information lies with you and/or your care-partner.

## 2017-06-17 NOTE — Op Note (Signed)
Gary Moody Patient Name: Gary Moody Procedure Date: 06/17/2017 8:20 AM MRN: [id number] Endoscopist: Remo Lipps P. Kelwin Gibler MD, MD Age: 61 Referring MD:  Date of Birth: 12-May-1956 Gender: Male Account #: [id number] Procedure:                Colonoscopy Indications:              Screening for colorectal malignant neoplasm Medicines:                Monitored Anesthesia Care Procedure:                Pre-Anesthesia Assessment:                           - Prior to the procedure, a History and Physical                            was performed, and patient medications and                            allergies were reviewed. The patient's tolerance of                            previous anesthesia was also reviewed. The risks                            and benefits of the procedure and the sedation                            options and risks were discussed with the patient.                            All questions were answered, and informed consent                            was obtained. Prior Anticoagulants: The patient has                            taken no previous anticoagulant or antiplatelet                            agents. ASA Grade Assessment: III - A patient with                            severe systemic disease. After reviewing the risks                            and benefits, the patient was deemed in                            satisfactory condition to undergo the procedure.                           After obtaining informed consent, the colonoscope  was passed under direct vision. Throughout the                            procedure, the patient's blood pressure, pulse, and                            oxygen saturations were monitored continuously. The                            Colonoscope was introduced through the anus and                            advanced to the the terminal ileum, with                            identification  of the appendiceal orifice and IC                            valve. The colonoscopy was performed without                            difficulty. The patient tolerated the procedure                            well. The quality of the bowel preparation was                            good. The terminal ileum, ileocecal valve,                            appendiceal orifice, and rectum were photographed. Scope In: 8:25:57 AM Scope Out: 8:47:06 AM Scope Withdrawal Time: 0 hours 17 minutes 45 seconds  Total Procedure Duration: 0 hours 21 minutes 9 seconds  Findings:                 The perianal and digital rectal examinations were                            normal.                           The terminal ileum appeared normal.                           A 4 mm polyp was found in the cecum. The polyp was                            sessile. The polyp was removed with a cold snare.                            Resection and retrieval were complete.                           A 3 mm polyp was found in the ascending colon. The  polyp was sessile. The polyp was removed with a                            cold snare. Resection and retrieval were complete.                           Three sessile polyps were found in the transverse                            colon. The polyps were 3 to 7 mm in size. These                            polyps were removed with a cold snare. Resection                            and retrieval were complete.                           Anal papilla(e) were hypertrophied.                           The exam was otherwise without abnormality. Complications:            No immediate complications. Estimated blood loss:                            Minimal. Estimated Blood Loss:     Estimated blood loss was minimal. Impression:               - The examined portion of the ileum was normal.                           - One 4 mm polyp in the cecum, removed with a cold                             snare. Resected and retrieved.                           - One 3 mm polyp in the ascending colon, removed                            with a cold snare. Resected and retrieved.                           - Three 3 to 7 mm polyps in the transverse colon,                            removed with a cold snare. Resected and retrieved.                           - Anal papilla(e) were hypertrophied.                           - The examination was otherwise normal.  Recommendation:           - Patient has a contact number available for                            emergencies. The signs and symptoms of potential                            delayed complications were discussed with the                            patient. Return to normal activities tomorrow.                            Written discharge instructions were provided to the                            patient.                           - Resume previous diet.                           - Continue present medications.                           - Await pathology results.                           - Repeat colonoscopy is recommended for                            surveillance. The colonoscopy date will be                            determined after pathology results from today's                            exam become available for review.                           - No ibuprofen, naproxen, or other non-steroidal                            anti-inflammatory drugs for 2 weeks after polyp                            removal. Remo Lipps P. Malik Paar MD, MD 06/17/2017 8:51:34 AM This report has been signed electronically.

## 2017-06-18 ENCOUNTER — Telehealth: Payer: Self-pay | Admitting: *Deleted

## 2017-06-18 NOTE — Telephone Encounter (Signed)
  Follow up Call-  Call back number 06/17/2017  Post procedure Call Back phone  # [phone]  Permission to leave phone message Yes     Patient questions  Do you have a fever, pain , or abdominal swelling? No. Pain Score  0 *  Have you tolerated food without any problems? Yes.    Have you been able to return to your normal activities? Yes.    Do you have any questions about your discharge instructions: Diet   No. Medications  No. Follow up visit  No.  Do you have questions or concerns about your Care? No.  Actions: * If pain score is 4 or above: No action needed, pain <4.

## 2017-06-23 ENCOUNTER — Encounter: Payer: Self-pay | Admitting: Gastroenterology

## 2017-07-05 ENCOUNTER — Ambulatory Visit (HOSPITAL_COMMUNITY)
Admission: RE | Admit: 2017-07-05 | Discharge: 2017-07-05 | Disposition: A | Discharge status: Home / Self Care | Payer: BLUE CROSS/BLUE SHIELD | Source: Ambulatory Visit | Attending: Cardiology | Admitting: Cardiology

## 2017-07-05 DIAGNOSIS — I712 Thoracic aortic aneurysm, without rupture, unspecified: Secondary | ICD-10-CM

## 2017-07-05 LAB — CREATININE, SERUM: Creatinine, Ser: 0.99 mg/dL (ref 0.61–1.24)

## 2017-07-05 MED ORDER — GADOBENATE DIMEGLUMINE 529 MG/ML IV SOLN
15.0000 mL | Freq: Once | INTRAVENOUS | Status: AC
  Administered 2017-07-05: 15 mL via INTRAVENOUS

## 2018-01-17 ENCOUNTER — Other Ambulatory Visit: Payer: Self-pay | Admitting: Cardiology

## 2018-01-18 ENCOUNTER — Ambulatory Visit: Payer: BLUE CROSS/BLUE SHIELD | Admitting: Cardiology

## 2018-01-19 NOTE — Progress Notes (Signed)
Cardiology Office Note   Date:  01/20/2018   ID:  Gary Moody, DOB [birth year], MRN [id number]  PCP:  Velna Hatchet, MD  Cardiologist:   Minus Breeding, MD    No chief complaint on file.     History of Present Illness: Gary Moody is a 62 y.o. male who presents for ascending aortic aneurysm. He moved from [place] .  I followed up last Dec with an MRI that was 47 mm and unchanged from previous.  He has had coronary calcium and had a negative POET (Plain Old Exercise Treadmill) in 2017.  Since then he he has done well.  He is moving back to [place].  He is been very active.  The patient denies any new symptoms such as chest discomfort, neck or arm discomfort. There has been no new shortness of breath, PND or orthopnea. There have been no reported palpitations, presyncope or syncope.  He hunts for arrow heads and walks quite a bit with this.  He is moving back to [place] to as his daughter and grandchildren are moving there.   Past Medical History:  Diagnosis Date  . Chronic back pain   . Hyperlipidemia   . Hypogonadism in male   . Thoracic ascending aortic aneurysm [facility])     Past Surgical History:  Procedure Laterality Date  . ANTERIOR CRUCIATE LIGAMENT REPAIR    . COLONOSCOPY     age 34- no issues, normal exam per pt      Current Outpatient Medications  Medication Sig Dispense Refill  . atorvastatin (LIPITOR) 20 MG tablet Take 1 tablet (20 mg total) by mouth daily. 90 tablet 3  . atorvastatin (LIPITOR) 40 MG tablet Take 1 tablet (40 mg total) by mouth daily. 90 tablet 3  . losartan (COZAAR) 50 MG tablet Take 1 tablet (50 mg total) by mouth 2 (two) times daily. 180 tablet 3  . metoprolol succinate (TOPROL-XL) 25 MG 24 hr tablet Take 1 tablet (25 mg total) by mouth daily. 90 tablet 3  . testosterone (ANDROGEL) 50 MG/5GM (1%) GEL Place 5 g onto the skin daily. 1.62% daily     No current facility-administered medications for this visit.     Allergies:   Avelox  [moxifloxacin hcl in nacl]; Latex; and Sulfa antibiotics    ROS:  Please see the history of present illness.   Otherwise, review of systems are positive none   All other systems are reviewed and negative.    PHYSICAL EXAM: VS:  BP 122/84   Pulse 60   Ht 5\' 9"  (1.753 m)   Wt 179 lb 9.6 oz (81.5 kg)   BMI 26.52 kg/m  , BMI Body mass index is 26.52 kg/m.  GENERAL:  Well appearing NECK:  No jugular venous distention, waveform within normal limits, carotid upstroke brisk and symmetric, no bruits, no thyromegaly LUNGS:  Clear to auscultation bilaterally CHEST:  Unremarkable HEART:  PMI not displaced or sustained,S1 and S2 within normal limits, no S3, no S4, no clicks, no rubs, no murmurs ABD:  Flat, positive bowel sounds normal in frequency in pitch, no bruits, no rebound, no guarding, no midline pulsatile mass, no hepatomegaly, no splenomegaly EXT:  2 plus pulses throughout, no edema, no cyanosis no clubbing   EKG:  EKG is   ordered today. The ekg ordered today demonstrates sinus bradycardia, rate 60, axis within normal limits, intervals within normal limits, no acute ST-T wave changes.   Wt Readings from Last 3 Encounters:  01/20/18  179 lb 9.6 oz (81.5 kg)  06/17/17 177 lb (80.3 kg)  04/28/17 177 lb (80.3 kg)      Other studies Reviewed: Additional studies/ records that were reviewed today include: MRI Review of the above records demonstrates:     ASSESSMENT AND PLAN:  ASCENDING ANEURYSM:   This was 47 mm on MRI in Dec of last year.  This was 43 mm in 2009.  This can be followed when he moves back to [place].   DYSLIPIDEMIA:  This is to be followed by Velna Hatchet, MD   LDL last year was 23.  No change in therapy.   CORONARY CALCIUM:   POET (Plain Old Exercise Treadmill) was negative November 2017.  He walked 12 minutes on this test.  No change in therapy or change in testing is indicated.   Current medicines are reviewed at length with the patient today.  The patient  had no concerns regarding medicines.  The following changes have been made:  None  Labs/ tests ordered today include:   None   Orders Placed This Encounter  Procedures  . EKG 12-Lead     Disposition:   FU with me in [place].   Signed, Minus Breeding, MD  01/20/2018 4:33 PM    Gary Moody

## 2018-01-20 ENCOUNTER — Ambulatory Visit: Payer: BLUE CROSS/BLUE SHIELD | Admitting: Cardiology

## 2018-01-20 ENCOUNTER — Encounter: Payer: Self-pay | Admitting: Cardiology

## 2018-01-20 VITALS — BP 122/84 | HR 60 | Ht 69.0 in | Wt 179.6 lb

## 2018-01-20 DIAGNOSIS — I712 Thoracic aortic aneurysm, without rupture, unspecified: Secondary | ICD-10-CM

## 2018-01-20 DIAGNOSIS — R931 Abnormal findings on diagnostic imaging of heart and coronary circulation: Secondary | ICD-10-CM | POA: Diagnosis not present

## 2018-01-20 MED ORDER — LOSARTAN POTASSIUM 50 MG PO TABS: 50.0000 mg | ORAL_TABLET | Freq: Two times a day (BID) | ORAL | 3 refills | Status: AC

## 2018-01-20 MED ORDER — METOPROLOL SUCCINATE ER 25 MG PO TB24: 25.0000 mg | ORAL_TABLET | Freq: Every day | ORAL | 3 refills | Status: AC

## 2018-01-20 MED ORDER — ATORVASTATIN CALCIUM 20 MG PO TABS: 20.0000 mg | ORAL_TABLET | Freq: Every day | ORAL | 3 refills | Status: AC

## 2018-01-20 MED ORDER — ATORVASTATIN CALCIUM 40 MG PO TABS: 40.0000 mg | ORAL_TABLET | Freq: Every day | ORAL | 3 refills | Status: AC

## 2018-01-20 NOTE — Patient Instructions (Signed)
Medication Instructions:  Continue current medications  If you need a refill on your cardiac medications before your next appointment, please call your pharmacy.  Labwork: None Ordered  Testing/Procedures: None Ordered  Follow-Up: Your physician wants you to follow-up in: As Needed.      Thank you for choosing [facility]!!       

## 2018-05-03 IMAGING — MR MR MRA CHEST W/ OR W/O CM
15 series · 16 of 16 positions shown · IV contrast (25    Multihance)
Comparison: 07/08/2016

CLINICAL DATA: Thoracic aortic aneurysm

EXAM:
MRA CHEST WITH CONTRAST
TECHNIQUE: Multiplanar, multiecho pulse sequences of the chest were obtained
with intravenous contrast. Angiographic images of chest were
obtained using MRA technique with intravenous contrast.
CONTRAST:  15mL MULTIHANCE GADOBENATE DIMEGLUMINE 529 MG/ML IV SOLN

[Series 5: T1 · axial · 8.0mm · 0.74mm/px · 1 of 25 slices shown]
[im 1/25]
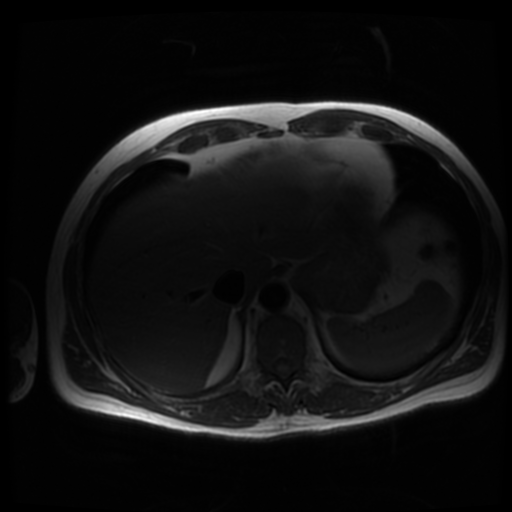

[Series 6: bSSFP · oblique · 6.0mm · 1.09mm/px · 1 of 240 slices shown (1 of 4)]
[im 1/240]
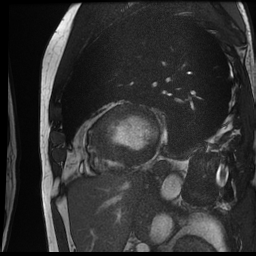

[Series 7: bSSFP · axial · 8.0mm · 1.37mm/px · 1 of 38 slices shown (2 of 4)]
[im 1/38]
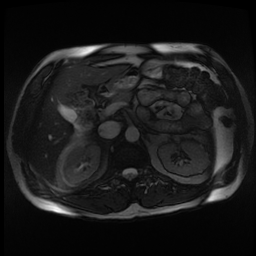

[Series 8: bSSFP · coronal · 8.0mm · 1.64mm/px · 1 of 26 slices shown (3 of 4)]
[im 1/26]
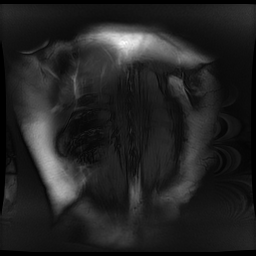

[Series 9: bSSFP · sagittal · 8.0mm · 1.37mm/px · 2 of 200 slices shown (4 of 4)]
[im 1/200]
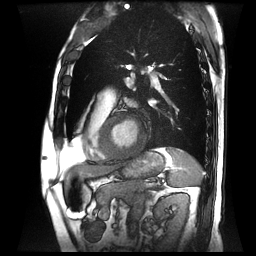
[im 200/200]
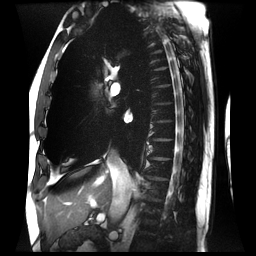

[Series 10: T1 dynamic · axial · non-contrast · 5.0mm · 0.78mm/px · 1 of 112 slices shown (1 of 2)]
[im 1/112]
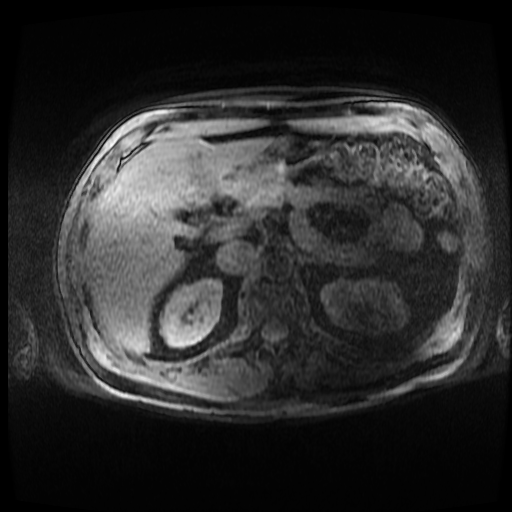

[Series 11: T1 dynamic · coronal · non-contrast · 5.0mm · 0.78mm/px · 1 of 80 slices shown (2 of 2)]
[im 1/80]
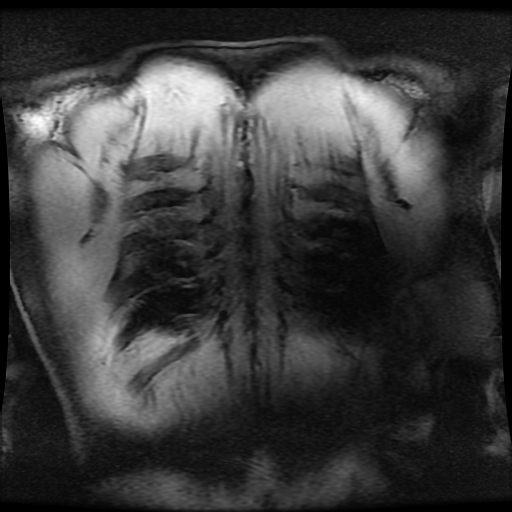

[Series 13: T1 dynamic post-contrast · axial · 5.0mm · 0.78mm/px · 1 of 112 slices shown (1 of 2)]
[im 1/112]
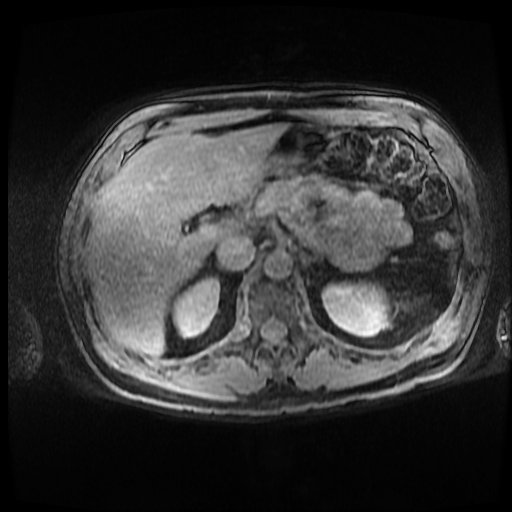

[Series 14: T1 dynamic post-contrast · coronal · 5.0mm · 0.78mm/px · 1 of 80 slices shown (2 of 2)]
[im 1/80]
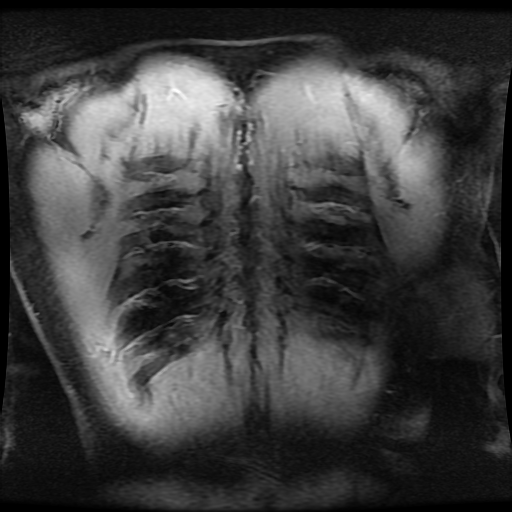

[Series 1200: MRA · sagittal · 3.0mm · 0.66mm/px · 1 of 68 slices shown (1 of 4)]
[im 1/68]
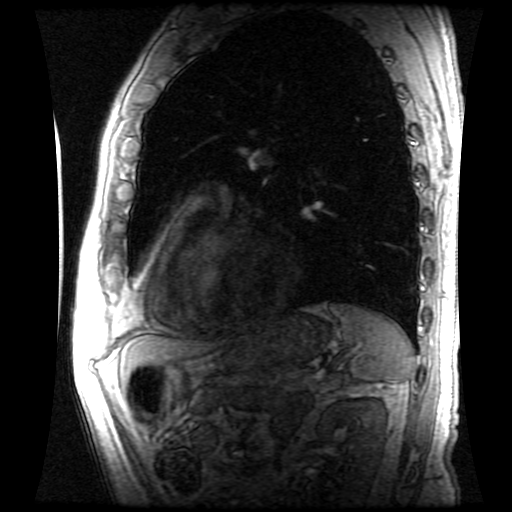

[Series 1201: MRA · sagittal · 3.0mm · 0.66mm/px · 1 of 68 slices shown (2 of 4)]
[im 1/68]
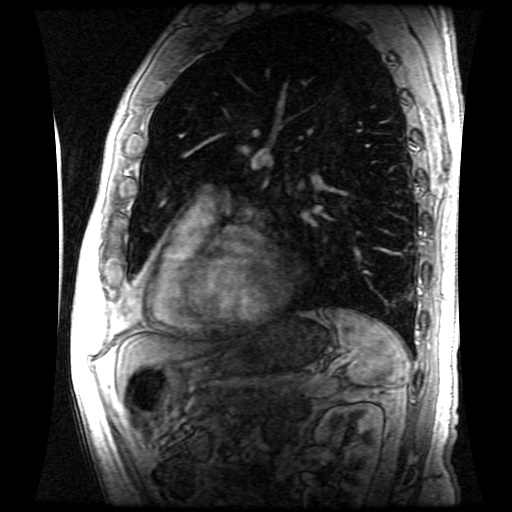

[Series 1202: MRA · sagittal · 3.0mm · 0.66mm/px · 1 of 68 slices shown (3 of 4)]
[im 1/68]
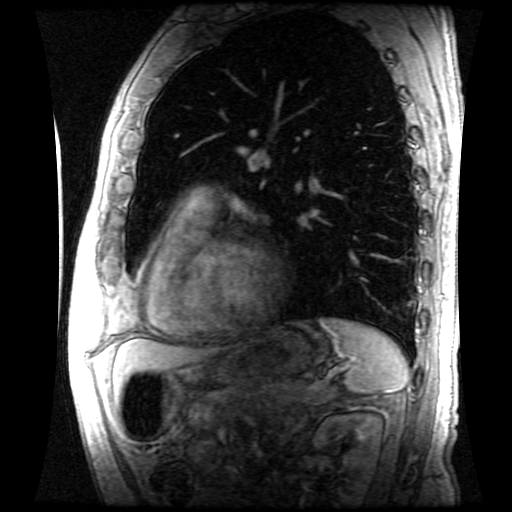

[((id)/(id)/1)-((id)/(id)/1) · sagittal · 3.0mm · 0.66mm/px · 1 of 68 slices shown (1 of 2)]
[im 1/68]
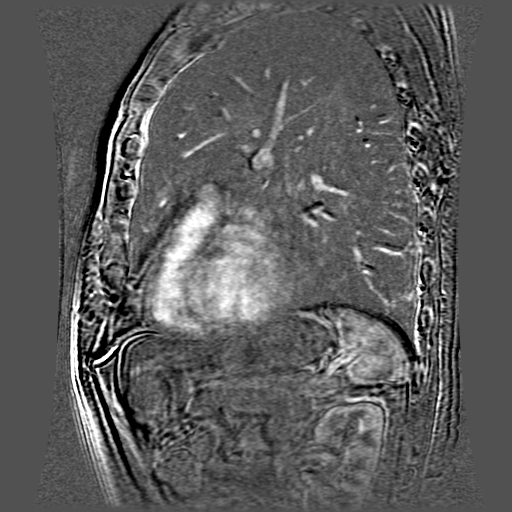

[((id)/(id)/1)-((id)/(id)/1) · sagittal · 3.0mm · 0.66mm/px · 1 of 68 slices shown (2 of 2)]
[im 1/68]
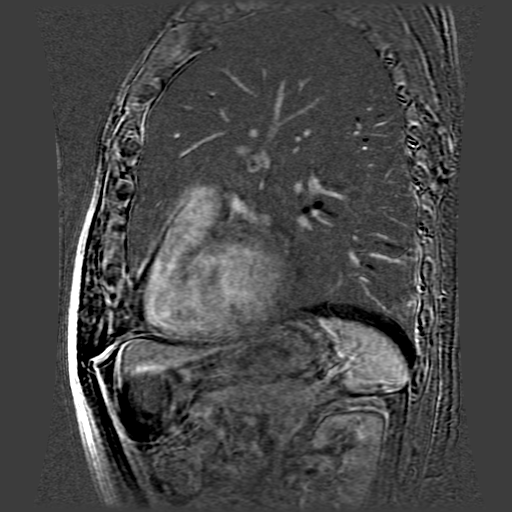

[MRA · sagittal · 3.0mm · 0.66mm/px · 1 of 14 slices shown (4 of 4)]
[im 1/14]
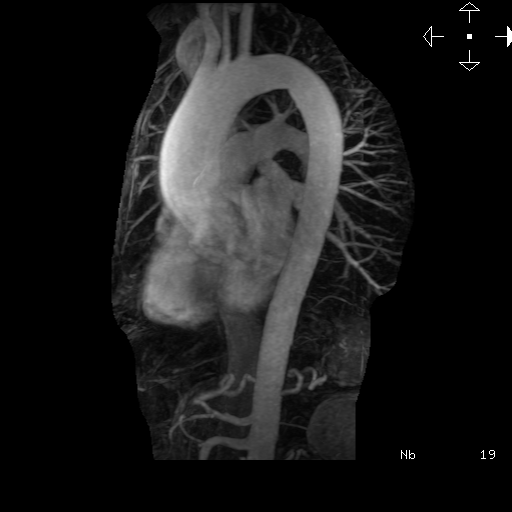

[16 of 16 positions shown; findings below may reference images not displayed]

FINDINGS: Maximal diameter of the ascending aorta at the sinus of all saw
above, Mesa junction, and ascending aorta are 4.5 cm,
cm, and 4.7 cm. This is stable compared with prior imaging. There is
no evidence of dissection or intramural hematoma. The great vessels
are patent within the confines of the examination. The descending
aorta is nonaneurysmal. Limited imaging of the abdomen demonstrates
patency of the celiac and SMA. Two left renal arteries and a single
right renal artery are patent.

There is no obvious mediastinal mass effect. Lungs are grossly
clear. Tiny hypodensity in the left lobe of the liver is stable. See
image 99 of series 13.
IMPRESSION: Stable aneurysmal dilatation of the ascending thoracic aorta at
cm. Ascending thoracic aortic aneurysm. Recommend semi-annual
imaging followup by CTA or MRA and referral to cardiothoracic
surgery if not already obtained. This recommendation follows 6535
ACCF/AHA/AATS/ACR/ASA/SCA/JAISWAL/ALYSON/HIRANO/ASIEDU Guidelines for the
Diagnosis and Management of Patients With Thoracic Aortic Disease.
Circulation. 6535; 121: [id number]

## 2020-07-03 ENCOUNTER — Encounter: Payer: Self-pay | Admitting: Gastroenterology

## 2020-07-25 ENCOUNTER — Telehealth: Payer: Self-pay | Admitting: Cardiology

## 2020-07-25 NOTE — Telephone Encounter (Signed)
[facility] is calling requesting the patient's MRA results from 2017 and 2018 due just performing one themselves and wanting to compare. Their fax number is [phone]. Please advise.

## 2020-07-25 NOTE — Telephone Encounter (Signed)
[facility]  Faxed me a request for records on this patient, I faxed them today 07/25/20 fsw

## 2020-09-17 ENCOUNTER — Encounter: Payer: Self-pay | Admitting: Gastroenterology
